# Patient Record
Sex: Female | Born: 2000 | Hispanic: Yes | Marital: Single | Smoking: Never smoker
Health system: Southern US, Community
[De-identification: ages and names within clinical notes are randomized; demographics above are authoritative.]

---

## 2021-06-28 ENCOUNTER — Encounter: Payer: Self-pay | Admitting: Emergency Medicine

## 2021-06-28 ENCOUNTER — Emergency Department
Admission: EM | Admit: 2021-06-28 | Discharge: 2021-06-28 | Disposition: A | Discharge status: Home / Self Care | Payer: Self-pay | Attending: Emergency Medicine | Admitting: Emergency Medicine

## 2021-06-28 ENCOUNTER — Other Ambulatory Visit: Payer: Self-pay

## 2021-06-28 ENCOUNTER — Emergency Department: Payer: Self-pay

## 2021-06-28 DIAGNOSIS — R109 Unspecified abdominal pain: Secondary | ICD-10-CM

## 2021-06-28 DIAGNOSIS — R1030 Lower abdominal pain, unspecified: Secondary | ICD-10-CM

## 2021-06-28 DIAGNOSIS — R1032 Left lower quadrant pain: Secondary | ICD-10-CM | POA: Insufficient documentation

## 2021-06-28 LAB — BASIC METABOLIC PANEL
Anion gap: 9 (ref 5–15)
BUN: 19 mg/dL (ref 6–20)
CO2: 22 mmol/L (ref 22–32)
Calcium: 9.2 mg/dL (ref 8.9–10.3)
Chloride: 107 mmol/L (ref 98–111)
Creatinine, Ser: 0.7 mg/dL (ref 0.44–1.00)
GFR, Estimated: >60 mL/min (ref >60)
Glucose, Bld: 93 mg/dL (ref 70–99)
Potassium: 4 mmol/L (ref 3.5–5.1)
Sodium: 138 mmol/L (ref 135–145)

## 2021-06-28 LAB — POC URINE PREG, ED: Preg Test, Ur: NEGATIVE

## 2021-06-28 LAB — URINALYSIS, COMPLETE (UACMP) WITH MICROSCOPIC
Bacteria, UA: NONE SEEN
Bilirubin Urine: NEGATIVE
Glucose, UA: NEGATIVE mg/dL
Hgb urine dipstick: NEGATIVE
Ketones, ur: NEGATIVE mg/dL
Nitrite: NEGATIVE
Protein, ur: NEGATIVE mg/dL
Specific Gravity, Urine: 1.026 (ref 1.005–1.030)
pH: 5 (ref 5.0–8.0)

## 2021-06-28 LAB — CBC
HCT: 43.1 % (ref 36.0–46.0)
Hemoglobin: 13.9 g/dL (ref 12.0–15.0)
MCH: 29.8 pg (ref 26.0–34.0)
MCHC: 32.3 g/dL (ref 30.0–36.0)
MCV: 92.5 fL (ref 80.0–100.0)
Platelets: 384 10*3/uL (ref 150–400)
RBC: 4.66 MIL/uL (ref 3.87–5.11)
RDW: 13.2 % (ref 11.5–15.5)
WBC: 14.1 10*3/uL — ABNORMAL HIGH (ref 4.0–10.5)
nRBC: 0 % (ref 0.0–0.2)

## 2021-06-28 MED ORDER — KETOROLAC TROMETHAMINE 30 MG/ML IJ SOLN
15.0000 mg | Freq: Once | INTRAMUSCULAR | Status: AC
  Administered 2021-06-28: 12:00:00 15 mg via INTRAVENOUS
  Filled 2021-06-28: qty 1

## 2021-06-28 MED ORDER — MORPHINE SULFATE (PF) 4 MG/ML IV SOLN
4.0000 mg | Freq: Once | INTRAVENOUS | Status: AC
  Administered 2021-06-28: 10:00:00 4 mg via INTRAVENOUS
  Filled 2021-06-28: qty 1

## 2021-06-28 NOTE — ED Notes (Signed)
See triage note  presents with left lower back pain  states pain increases with movement  started couple of days ago  states developed pain after working out  ambulates well  denies and n/v/ or urinary sxs' ?

## 2021-06-28 NOTE — ED Provider Notes (Signed)
? ?[facility] ?Provider Note ? ? ? Event Date/Time  ? First MD Initiated Contact with Patient 06/28/21 [phone]   ?  (approximate) ? ? ?History  ? ?Flank Pain ? ? ?HPI ? ?Emily Ibarra is a 21 y.o. female without significant past medical history who presents accompanied by mother for assessment of some left-sided abdominal pain.  Patient states that started 4 to 5 days ago when she was working out.  It got worse today.  No urinary symptoms, back pain, right-sided abdominal pain, vaginal bleeding or discharge, chest pain, cough, shortness of breath, nausea, vomiting, diarrhea, constipation, headache, earache, sore throat or any other sick symptoms.  He states that eating makes her feel bloated but does not otherwise exacerbate the pain.  He denies EtOH use or illicit drug use.  No other acute concerns at this time.  No prior similar episodes. ? ?  ? ? ?Physical Exam  ?Triage Vital Signs: ?ED Triage Vitals  ?Enc Vitals Group  ?   BP 06/28/21 0925 134/82  ?   Pulse Rate 06/28/21 0925 82  ?   Resp 06/28/21 0925 16  ?   Temp 06/28/21 0925 98.4 ?F (36.9 ?C)  ?   Temp Source 06/28/21 0925 Oral  ?   SpO2 06/28/21 0925 98 %  ?   Weight 06/28/21 0932 215 lb (97.5 kg)  ?   Height 06/28/21 0932 5\' 3"  (1.6 m)  ?   Head Circumference --   ?   Peak Flow --   ?   Pain Score 06/28/21 0924 9  ?   Pain Loc --   ?   Pain Edu? --   ?   Excl. in [place]? --   ? ? ?Most recent vital signs: ?Vitals:  ? 06/28/21 0925  ?BP: 134/82  ?Pulse: 82  ?Resp: 16  ?Temp: 98.4 ?F (36.9 ?C)  ?SpO2: 98%  ? ? ?General: Awake, no distress.  ?CV:  Good peripheral perfusion.  ?Resp:  Normal effort.  ?Abd:  No distention.  Tender in the left lower quadrant.  No CVA tenderness.  Remainder of abdomen is soft. ?Other:   ? ? ?ED Results / Procedures / Treatments  ?Labs ?(all labs ordered are listed, but only abnormal results are displayed) ?Labs Reviewed  ?CBC - Abnormal; Notable for the following components:  ?    Result Value  ? WBC  14.1 (*)   ? All other components within normal limits  ?URINALYSIS, COMPLETE (UACMP) WITH MICROSCOPIC - Abnormal; Notable for the following components:  ? Color, Urine YELLOW (*)   ? APPearance HAZY (*)   ? Leukocytes,Ua TRACE (*)   ? All other components within normal limits  ?URINE CULTURE  ?BASIC METABOLIC PANEL  ?POC URINE PREG, ED  ? ? ? ?EKG ? ? ? ?RADIOLOGY ? ?CT abdomen pelvis on my interpretation without evidence of a kidney stone, perinephric stranding, diverticulitis or other clear acute abdominal or pelvic process.  As reviewed radiologist interpretation and agree with their findings of no acute process. ? ?Pelvic ultrasound my interpretation with unremarkable bilateral ovaries.  Also reviewed radiology interpretation and agree with their findings of normal left low resistance arterial and venous in both ovaries without any other acute process. ? ? ?PROCEDURES: ? ?Critical Care performed: No ? ?Procedures ? ? ? ?MEDICATIONS ORDERED IN ED: ?Medications  ?morphine (PF) 4 MG/ML injection 4 mg (4 mg Intravenous Given 06/28/21 1005)  ?ketorolac (TORADOL) 30 MG/ML injection 15 mg (15  mg Intravenous Given 06/28/21 1206)  ? ? ? ?IMPRESSION / MDM / ASSESSMENT AND PLAN / ED COURSE  ?I reviewed the triage vital signs and the nursing notes. ?             ?               ? ?Differential diagnosis includes, but is not limited to ovarian cyst, torsion, diverticulitis, kidney stone, cystitis and MSK. ? ?BMP without any significant electrolyte or metabolic derangements.  CBC shows WBC count of 14.1 without leukocytosis and acute anemia.  UA shows some trace leukocyte esterase but otherwise grossly unremarkable. ? ?CT abdomen pelvis on my interpretation without evidence of a kidney stone, perinephric stranding, diverticulitis or other clear acute abdominal or pelvic process.  As reviewed radiologist interpretation and agree with their findings of no acute process.  Patient has no urinary symptoms or lower pelvic pain to  suggest clinically significant cystitis.  Pregnancy test is negative. ? ?Pelvic ultrasound my interpretation with unremarkable bilateral ovaries.  Also reviewed radiology interpretation and agree with their findings of normal left low resistance arterial and venous in both ovaries without any other acute process. ? ?Overall unclear urology for patient's symptoms although certainly could be related to MSK seems that started during strenuous workout.  Given stable vitals otherwise reassuring exam work-up low suspicion for immediately life-threatening process I think patient is stable for discharge with close outpatient follow-up.  Discharged in stable condition.  Strict turn precautions advised and discussed. ?  ? ? ?FINAL CLINICAL IMPRESSION(S) / ED DIAGNOSES  ? ?Final diagnoses:  ?Flank pain  ?Lower abdominal pain  ? ? ? ?Rx / DC Orders  ? ?ED Discharge Orders   ? ? None  ? ?  ? ? ? ?Note:  This document was prepared using Dragon voice recognition software and may include unintentional dictation errors. ?  ?Lucrezia Starch, MD ?06/28/21 1241 ? ?

## 2021-06-28 NOTE — ED Triage Notes (Signed)
Pt comes into the ED via POV c/o left flank pain.  PT states the pain is worse with movement.  Pt denies any N/V/D.  Pt states she worked out Saturday and that is when the discomfort started and has been ongoing since then.  Pt denies any urinary symptoms at this time.  ?

## 2021-06-29 LAB — URINE CULTURE

## 2021-11-22 ENCOUNTER — Other Ambulatory Visit: Payer: Self-pay

## 2021-11-22 ENCOUNTER — Emergency Department
Admission: EM | Admit: 2021-11-22 | Discharge: 2021-11-22 | Disposition: A | Discharge status: Home / Self Care | Payer: No Typology Code available for payment source | Attending: Emergency Medicine | Admitting: Emergency Medicine

## 2021-11-22 ENCOUNTER — Encounter: Payer: Self-pay | Admitting: Emergency Medicine

## 2021-11-22 DIAGNOSIS — W268XXA Contact with other sharp object(s), not elsewhere classified, initial encounter: Secondary | ICD-10-CM | POA: Diagnosis not present

## 2021-11-22 DIAGNOSIS — Z23 Encounter for immunization: Secondary | ICD-10-CM | POA: Insufficient documentation

## 2021-11-22 DIAGNOSIS — S51812A Laceration without foreign body of left forearm, initial encounter: Secondary | ICD-10-CM | POA: Insufficient documentation

## 2021-11-22 DIAGNOSIS — Y990 Civilian activity done for income or pay: Secondary | ICD-10-CM | POA: Diagnosis not present

## 2021-11-22 DIAGNOSIS — S59912A Unspecified injury of left forearm, initial encounter: Secondary | ICD-10-CM | POA: Diagnosis present

## 2021-11-22 MED ORDER — LIDOCAINE HCL (PF) 1 % IJ SOLN
5.0000 mL | Freq: Once | INTRAMUSCULAR | Status: AC
  Administered 2021-11-22: 5 mL
  Filled 2021-11-22: qty 5

## 2021-11-22 MED ORDER — TETANUS-DIPHTH-ACELL PERTUSSIS 5-2.5-18.5 LF-MCG/0.5 IM SUSY
0.5000 mL | PREFILLED_SYRINGE | Freq: Once | INTRAMUSCULAR | Status: AC
  Administered 2021-11-22: 0.5 mL via INTRAMUSCULAR
  Filled 2021-11-22: qty 0.5

## 2021-11-22 NOTE — ED Provider Notes (Signed)
[facility] Emergency Department Provider Note     Event Date/Time   First MD Initiated Contact with Patient 11/22/21 2124     (approximate)   History   Laceration   HPI  History limited by Spanish language.  Tele-interpreter used for interview and exam.  Emily Ibarra is a 21 y.o. female to the ED for evaluation of work-related injury.  Patient presents with an abrasion/laceration to her left forearm.  Denies any injury at this time.  She is unclear of her current tetanus status.     Physical Exam   Triage Vital Signs: ED Triage Vitals  Enc Vitals Group     BP 11/22/21 2109 (!) 144/98     Pulse Rate 11/22/21 2109 86     Resp 11/22/21 2109 16     Temp 11/22/21 2109 99.1 F (37.3 C)     Temp Source 11/22/21 2109 Oral     SpO2 11/22/21 2109 95 %     Weight 11/22/21 2110 213 lb 13.5 oz (97 kg)     Height --      Head Circumference --      Peak Flow --      Pain Score 11/22/21 2109 5     Pain Loc --      Pain Edu? --      Excl. in [place]? --     Most recent vital signs: Vitals:   11/22/21 2109  BP: (!) 144/98  Pulse: 86  Resp: 16  Temp: 99.1 F (37.3 C)  SpO2: 95%    General Awake, no distress.  CV:  Good peripheral perfusion.  RESP:  Normal effort.  ABD:  No distention.  SKIN:  Left forearm with a 2 cm laceration with some surrounding abrasion.  No active bleeding appreciated.  Normal composite fist distally.   ED Results / Procedures / Treatments   Labs (all labs ordered are listed, but only abnormal results are displayed) Labs Reviewed - No data to display   EKG   RADIOLOGY   No results found.   PROCEDURES:  Critical Care performed: No  ..Laceration Repair  Date/Time: 11/22/2021 9:48 PM  Performed by: Lissa Hoard, PA-C Authorized by: Lissa Hoard, PA-C   Consent:    Consent obtained:  Verbal   Consent given by:  Patient   Risks, benefits, and alternatives were discussed:  yes     Risks discussed:  Pain and poor wound healing Universal protocol:    Site/side marked: yes     Patient identity confirmed:  Verbally with patient Anesthesia:    Anesthesia method:  Local infiltration   Local anesthetic:  Lidocaine 1% w/o epi Laceration details:    Location:  Shoulder/arm   Shoulder/arm location:  L lower arm   Length (cm):  2   Depth (mm):  4 Pre-procedure details:    Preparation:  Patient was prepped and draped in usual sterile fashion Exploration:    Limited defect created (wound extended): no     Hemostasis achieved with:  Direct pressure   Contaminated: no   Treatment:    Area cleansed with:  Saline   Amount of cleaning:  Standard   Irrigation solution:  Sterile saline   Irrigation volume:  5   Irrigation method:  Tap   Debridement:  None   Undermining:  None   Scar revision: no   Skin repair:    Repair method:  Sutures   Suture size:  4-0   Suture material:  Nylon   Suture technique:  Simple interrupted   Number of sutures:  3 Approximation:    Approximation:  Close Repair type:    Repair type:  Simple Post-procedure details:    Dressing:  Non-adherent dressing   Procedure completion:  Tolerated well, no immediate complications    MEDICATIONS ORDERED IN ED: Medications  Tdap (BOOSTRIX) injection 0.5 mL (0.5 mLs Intramuscular Given 11/22/21 2151)  lidocaine (PF) (XYLOCAINE) 1 % injection 5 mL (5 mLs Infiltration Given 11/22/21 2150)     IMPRESSION / MDM / ASSESSMENT AND PLAN / ED COURSE  I reviewed the triage vital signs and the nursing notes.                              Differential diagnosis includes, but is not limited to, forearm contusion, forearm abrasion, hematoma, laceration  Patient's presentation is most consistent with acute, uncomplicated illness.  Patient's diagnosis is consistent with forearm laceration. Patient will be discharged home with wound care instructions. Patient is to follow up with local urgent care for  suture removal in 7 to 10 days as needed or otherwise directed. Patient is given ED precautions to return to the ED for any worsening or new symptoms.     FINAL CLINICAL IMPRESSION(S) / ED DIAGNOSES   Final diagnoses:  Laceration of left forearm, initial encounter     Rx / DC Orders   ED Discharge Orders     None        Note:  This document was prepared using Dragon voice recognition software and may include unintentional dictation errors.    Lissa Hoard, PA-C 11/22/21 2246    Minna Antis, MD 11/22/21 2351

## 2021-11-22 NOTE — ED Triage Notes (Signed)
Pt presents for laceration to L forearm. Was pushing a pallet of material at work and arm was trapped between the boards causing a laceration. Bleeding controlled prior to arrival.  Unsure of last tdap.  No PMH or medications.

## 2021-11-22 NOTE — Discharge Instructions (Addendum)
Keep the wound clean, dry, and covered. See a local Urgent Care Center for suture removal in 7-10 days.   Mantenga la herida limpia, seca y [place]. Visite un [facility]ro [facility] local para retirar la sutura en 7 a 10 das.

## 2021-11-22 NOTE — ED Notes (Signed)
Pre Pac profile indicates the patient needs a UDS. Pts supervisor reiterated that this isnt a requirement for WC.

## 2021-11-22 NOTE — ED Notes (Signed)
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

## 2021-11-22 NOTE — ED Notes (Signed)
Patient discharged at this time. Ambulated to lobby with independent and steady gait. Breathing unlabored speaking in full sentences. Verbalized understanding of all discharge, follow up, and medication teaching. Discharged homed with all belongings.   

## 2023-03-20 IMAGING — US US PELVIS COMPLETE
1 series · 15 of 25 positions shown · non-contrast
Comparison: None.

CLINICAL DATA: Diffuse pelvic pain.

EXAM:
TRANSABDOMINAL ULTRASOUND OF PELVIS
DOPPLER ULTRASOUND OF OVARIES
TECHNIQUE: Transabdominal ultrasound examination of the pelvis was performed
including evaluation of the uterus, ovaries, adnexal regions, and
pelvic cul-de-sac.
Color and duplex Doppler ultrasound was utilized to evaluate blood
flow to the ovaries.

[Series 1: us transvaginal non-ob · 15 of 71 slices shown]
[im 1/71]
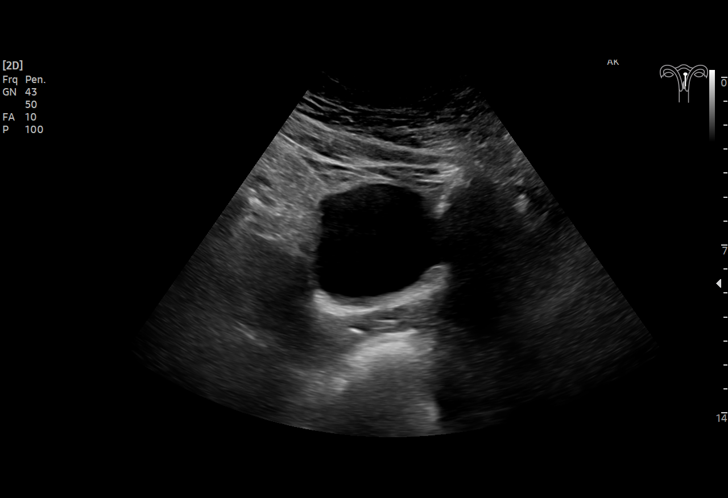
[im 6/71]
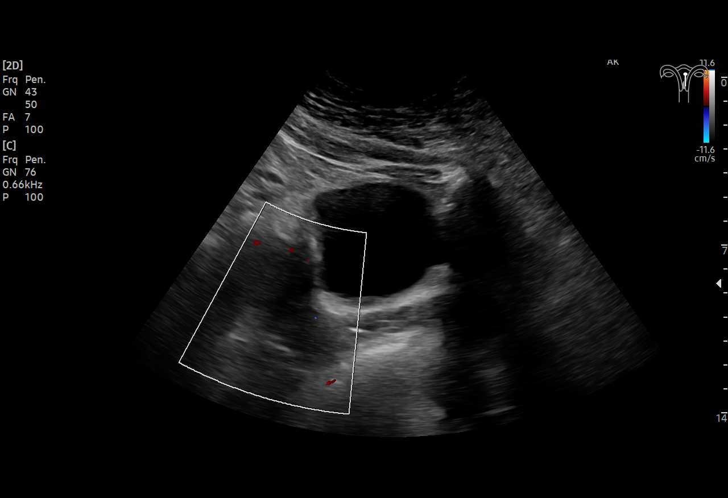
[im 12/71]
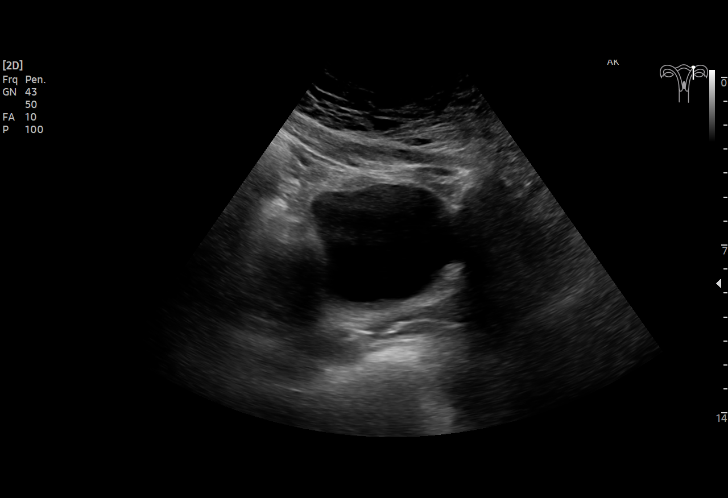
[im 15/71]
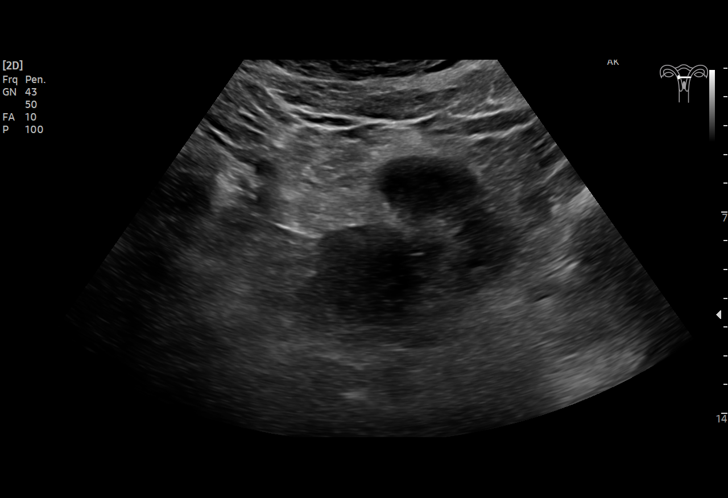
[im 21/71]
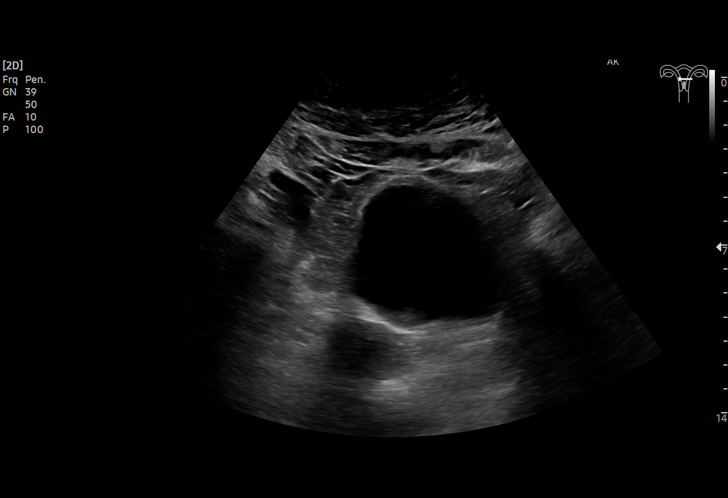
[im 27/71]
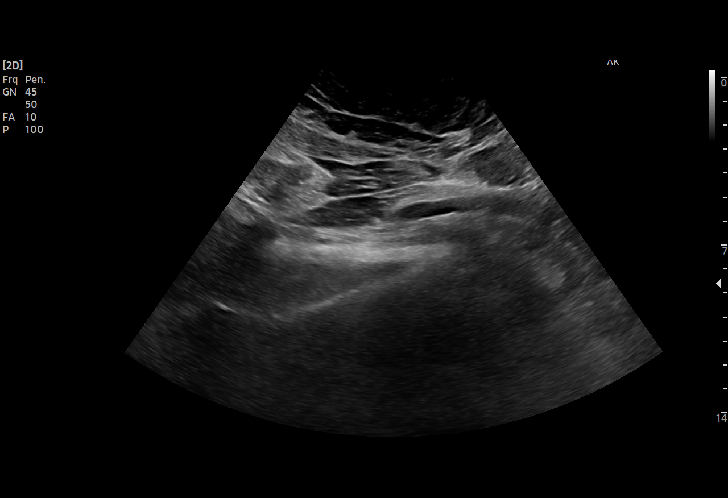
[im 30/71]
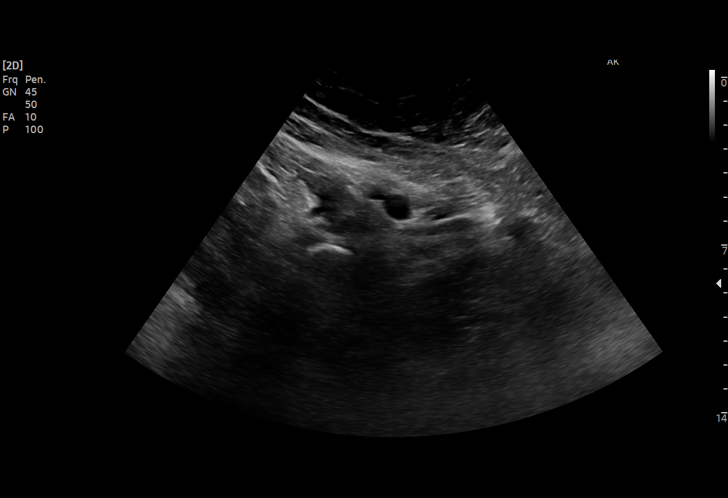
[im 36/71]
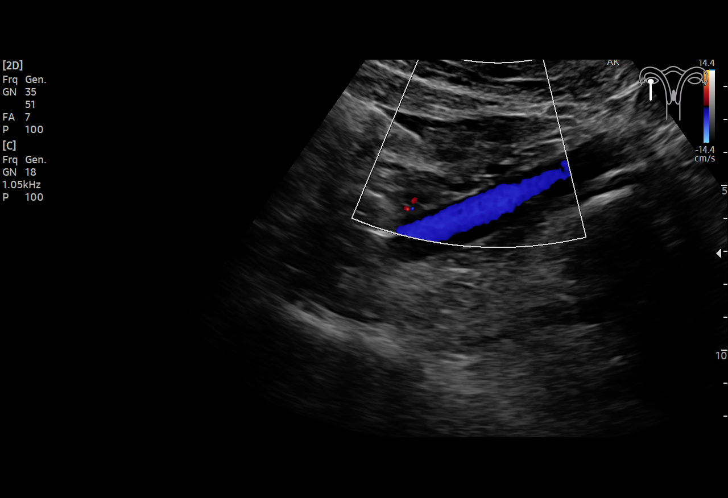
[im 41/71]
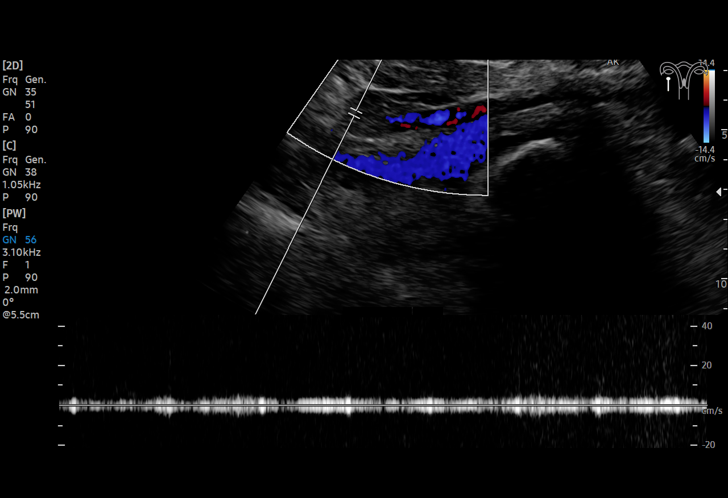
[im 44/71]
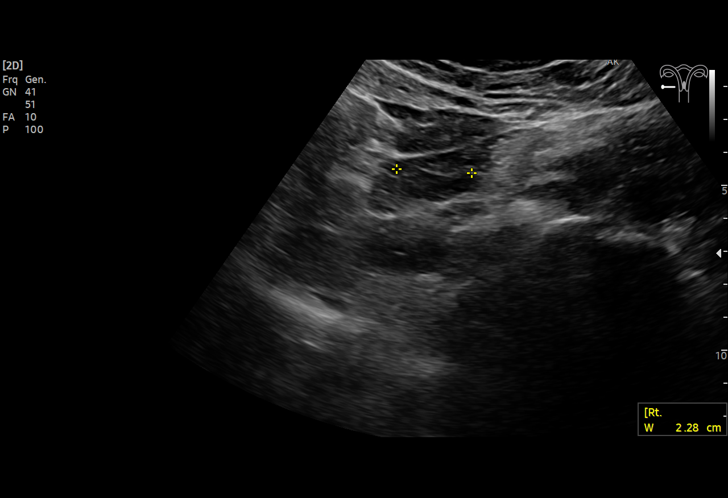
[im 50/71]
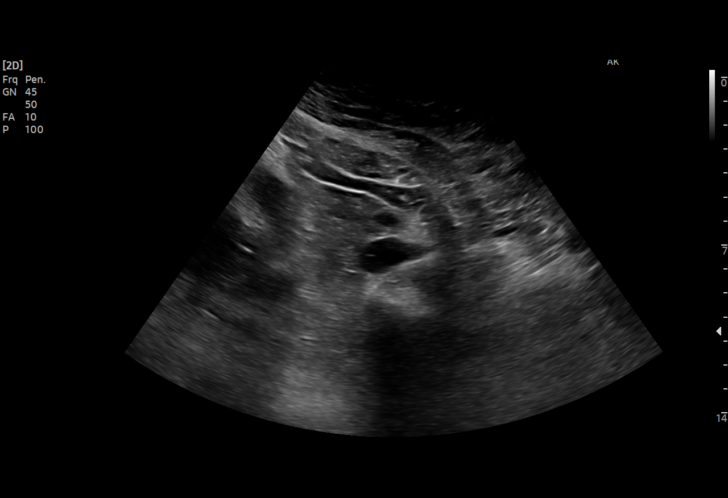
[im 56/71]
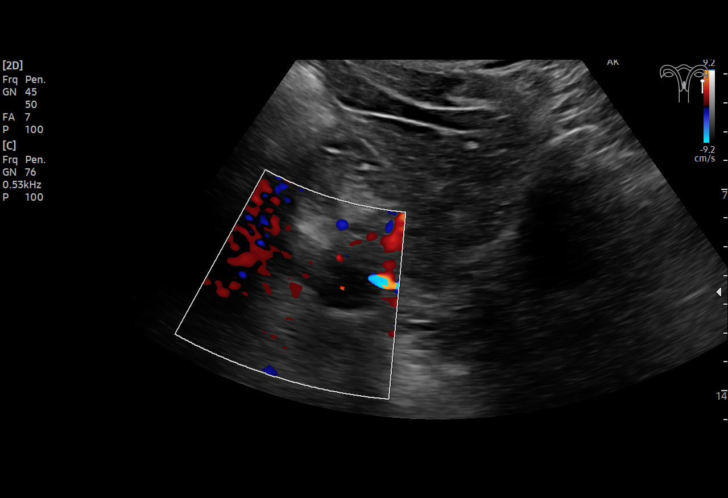
[im 59/71]
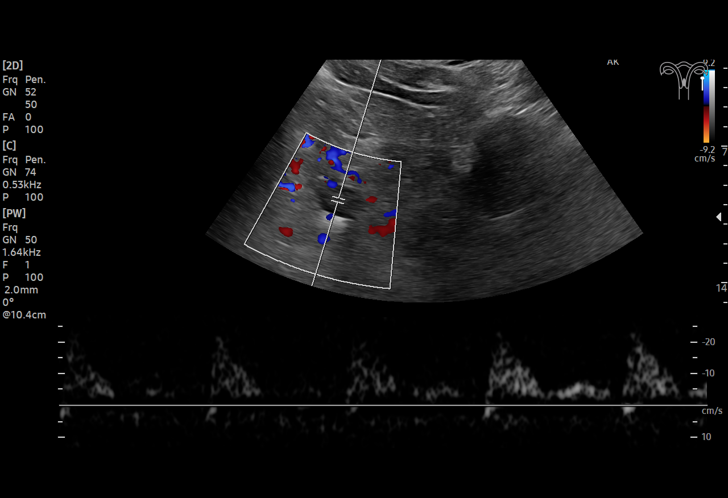
[im 65/71]
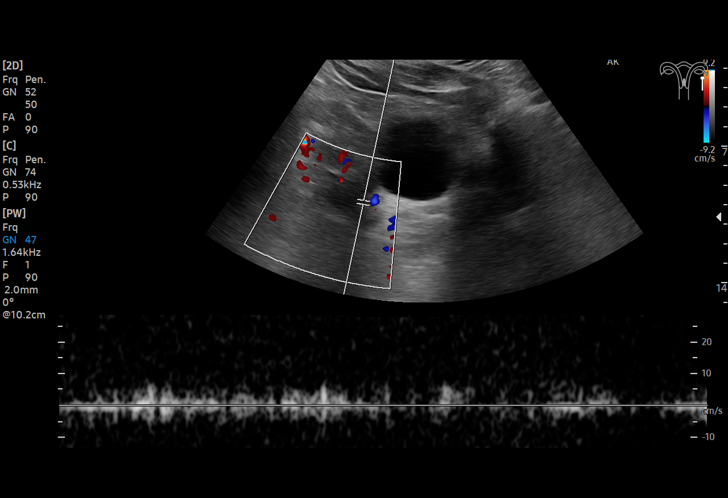
[im 71/71]
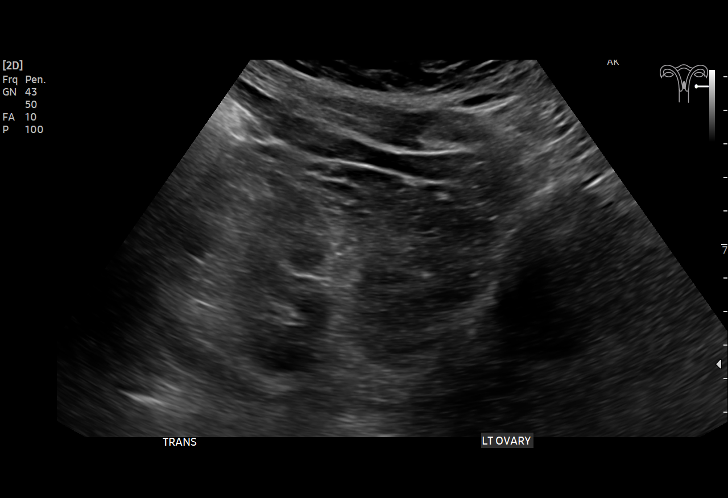

[15 of 25 positions shown; findings below may reference images not displayed]

FINDINGS: Uterus

Measurements: 6.6 x 3.6 x 3.7 cm = volume: 46.5 mL. 5 mm, within
normal limits

Endometrium

Thickness: 5 mm, within normal limits. No focal abnormality
visualized.

Right ovary

Measurements: 2.1 x 1.5 x 2.3 cm = volume: 3.8 mL. Normal
appearance/no adnexal mass.

Left ovary

Measurements: 2.1 x 1.9 x 1.8 cm = volume: 3.7 mL. Normal
appearance/no adnexal mass.

Pulsed Doppler evaluation demonstrates normal low-resistance
arterial and venous waveforms in both ovaries.
IMPRESSION: Normal transabdominal pelvic ultrasound.

## 2023-03-20 IMAGING — CT CT RENAL STONE PROTOCOL
2 of 4 series · 16 of 46 positions shown, 18 images · non-contrast
Comparison: None

CLINICAL DATA: Left flank pain question kidney stone, discomfort
began [REDACTED]



[Series 2: ap without · axial · non-contrast · 0.75mm/px · z∈[-821,-351]mm · 13 of 106 slices shown, 15 images]
[im 6/106  soft-tissue]
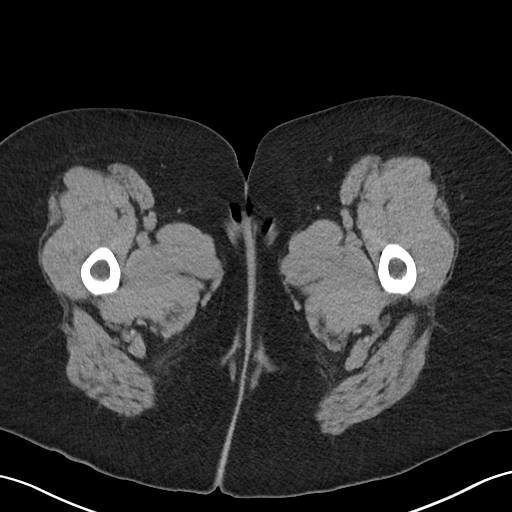
[im 6/106  bone]
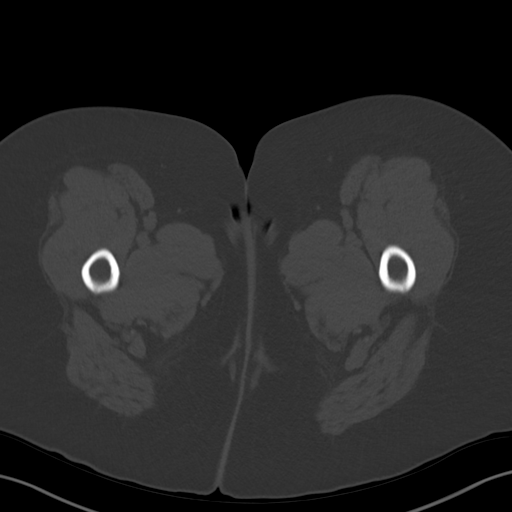
[im 12/106  soft-tissue]
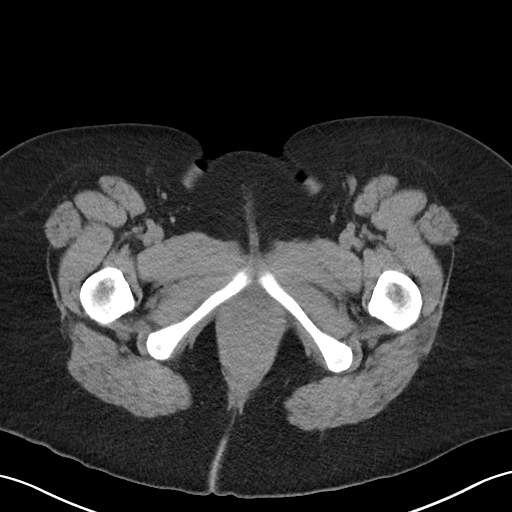
[im 24/106  soft-tissue]
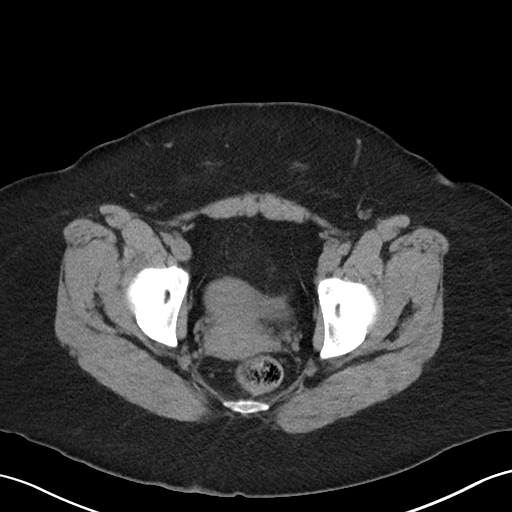
[im 30/106  soft-tissue]
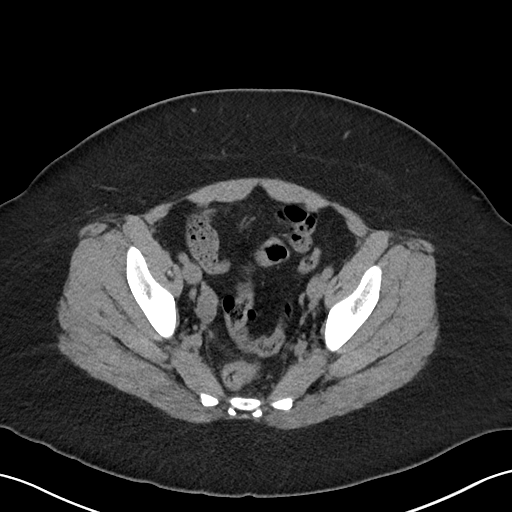
[im 36/106  soft-tissue]
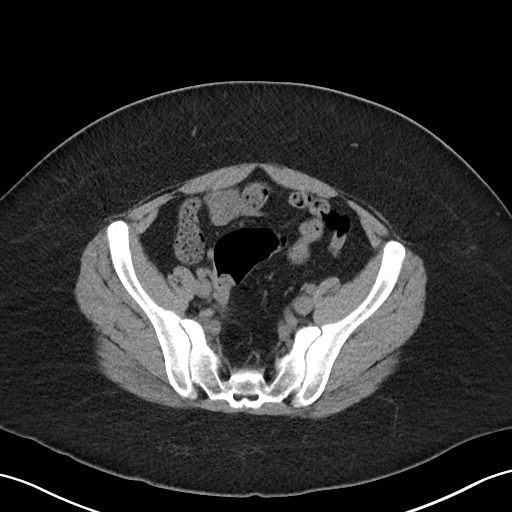
[im 47/106  soft-tissue]
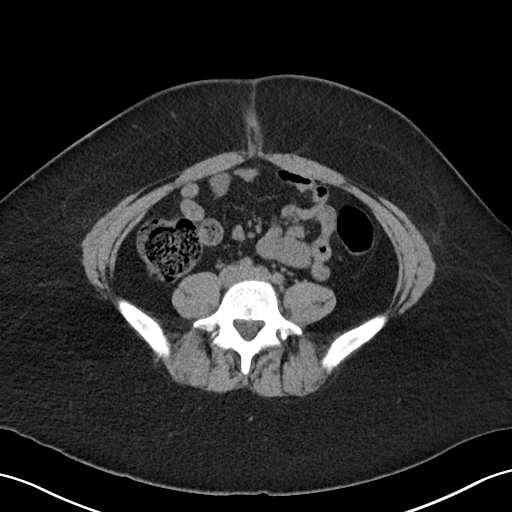
[im 53/106  soft-tissue]
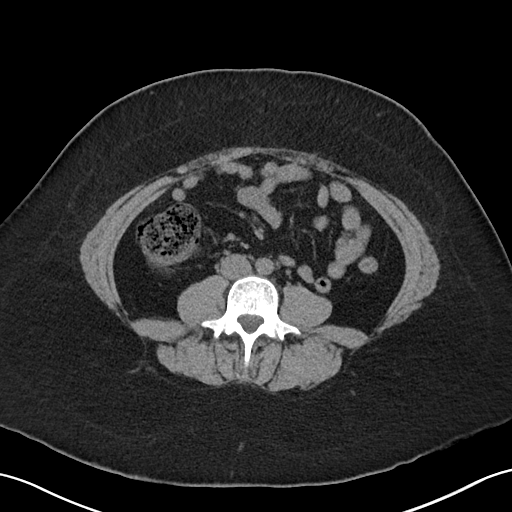
[im 59/106  soft-tissue]
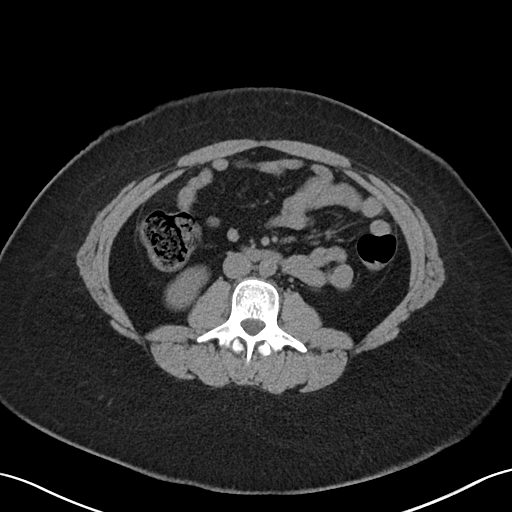
[im 71/106  soft-tissue]
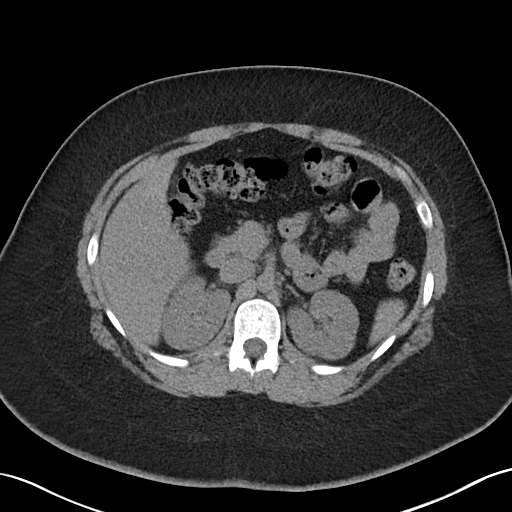
[im 71/106  bone]
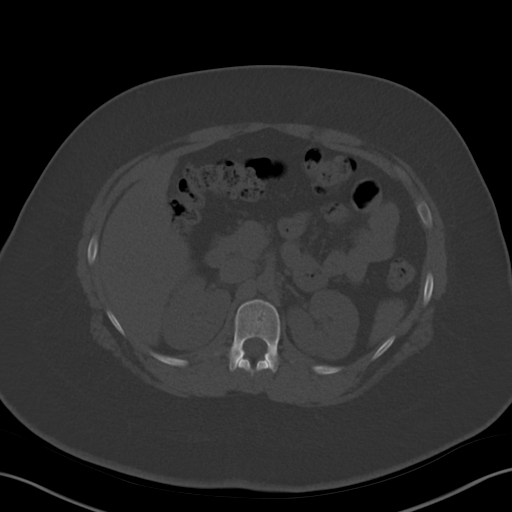
[im 76/106  soft-tissue]
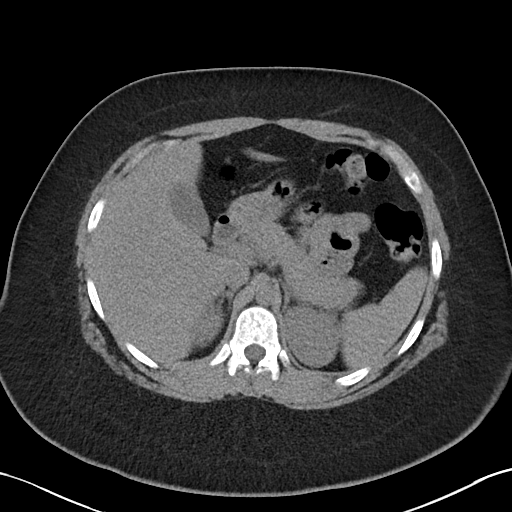
[im 82/106  soft-tissue]
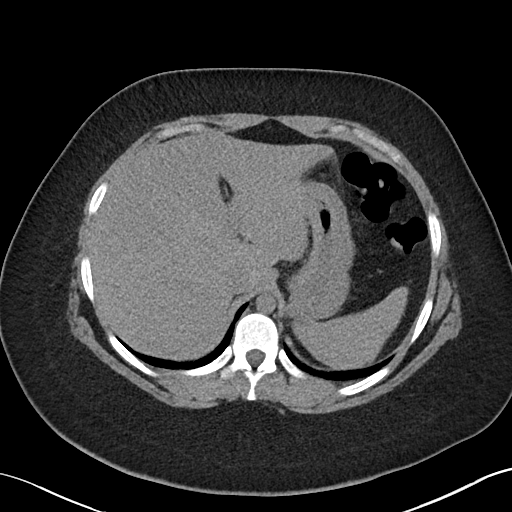
[im 94/106  soft-tissue]
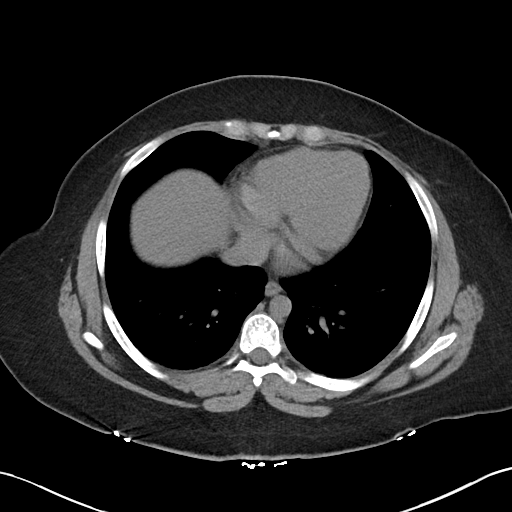
[im 100/106  soft-tissue]
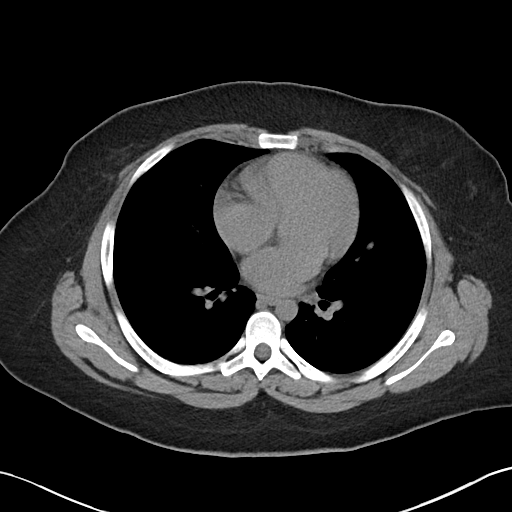

[Series 5: cor · coronal · 0.77mm/px · 3 of 84 slices shown]
[im 28/84  soft-tissue]
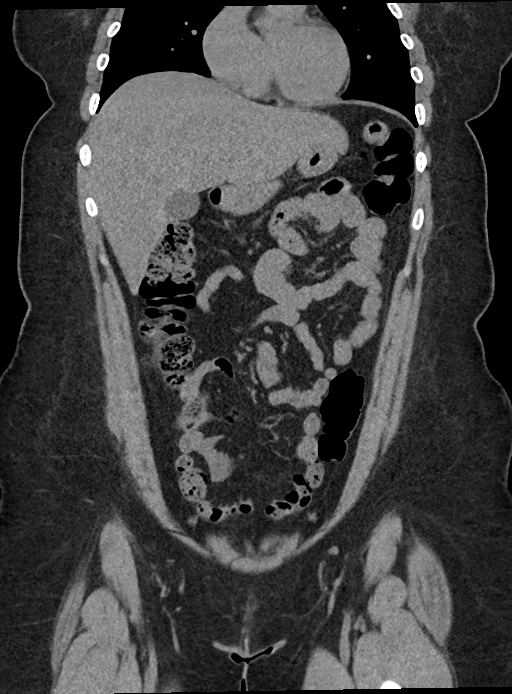
[im 37/84  soft-tissue]
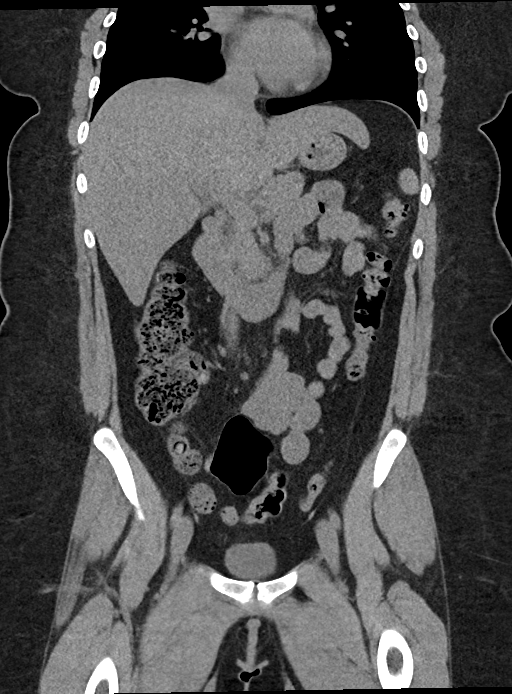
[im 47/84  soft-tissue]
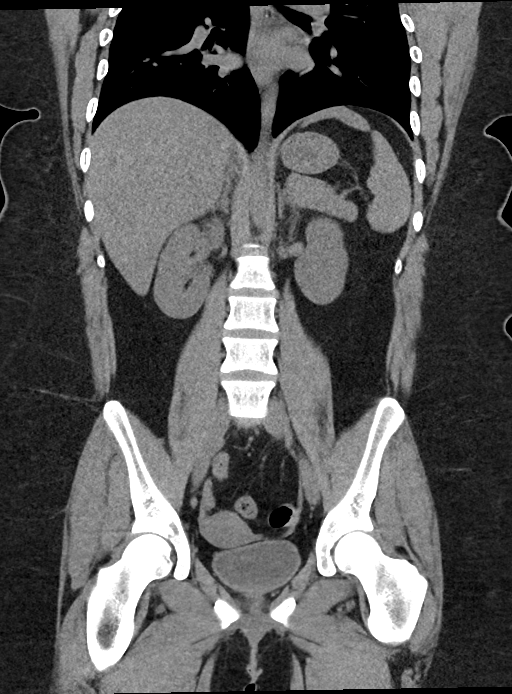

[16 of 46 positions shown; findings below may reference images not displayed]

FINDINGS: Lower chest: Minimal dependent atelectasis posterior lower lobes

Hepatobiliary: Gallbladder and liver normal appearance

Pancreas: Normal appearance

Spleen: Normal appearance

Adrenals/Urinary Tract: Adrenal glands, kidneys, ureters, and
bladder normal appearance

Stomach/Bowel: Normal appendix. Stomach and bowel loops normal
appearance.

Vascular/Lymphatic: LEFT pelvic phleboliths. Aorta normal caliber.
Circumaortic LEFT renal vein. No adenopathy.

Reproductive: Unremarkable uterus and adnexa

Other: No free air or free fluid. No inflammatory process or hernia.

Musculoskeletal: Unremarkable
IMPRESSION: No acute intra-abdominal or intrapelvic abnormalities.
